# Patient Record
Sex: Male | Born: 2000 | Race: Black or African American | Hispanic: No | Marital: Single | Smoking: Current every day smoker
Health system: Southern US, Community
[De-identification: ages and names within clinical notes are randomized; demographics above are authoritative.]

## PROBLEM LIST (undated history)

## (undated) DIAGNOSIS — J45909 Unspecified asthma, uncomplicated: Secondary | ICD-10-CM

---
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
## 2021-07-29 ENCOUNTER — Other Ambulatory Visit: Payer: Self-pay

## 2021-07-29 ENCOUNTER — Emergency Department (HOSPITAL_BASED_OUTPATIENT_CLINIC_OR_DEPARTMENT_OTHER)
Admission: EM | Admit: 2021-07-29 | Discharge: 2021-07-29 | Disposition: A | Discharge status: Home / Self Care | Payer: Medicaid Other | Attending: Emergency Medicine | Admitting: Emergency Medicine

## 2021-07-29 ENCOUNTER — Encounter (HOSPITAL_BASED_OUTPATIENT_CLINIC_OR_DEPARTMENT_OTHER): Payer: Self-pay | Admitting: Emergency Medicine

## 2021-07-29 ENCOUNTER — Other Ambulatory Visit (HOSPITAL_BASED_OUTPATIENT_CLINIC_OR_DEPARTMENT_OTHER): Payer: Self-pay

## 2021-07-29 DIAGNOSIS — Z202 Contact with and (suspected) exposure to infections with a predominantly sexual mode of transmission: Secondary | ICD-10-CM | POA: Diagnosis present

## 2021-07-29 DIAGNOSIS — Z711 Person with feared health complaint in whom no diagnosis is made: Secondary | ICD-10-CM

## 2021-07-29 LAB — URINALYSIS, ROUTINE W REFLEX MICROSCOPIC
Bilirubin Urine: NEGATIVE
Glucose, UA: NEGATIVE mg/dL
Hgb urine dipstick: NEGATIVE
Ketones, ur: NEGATIVE mg/dL
Nitrite: NEGATIVE
Protein, ur: NEGATIVE mg/dL
Specific Gravity, Urine: 1.025 (ref 1.005–1.030)
pH: 6.5 (ref 5.0–8.0)

## 2021-07-29 LAB — URINALYSIS, MICROSCOPIC (REFLEX)

## 2021-07-29 MED ORDER — DOXYCYCLINE HYCLATE 100 MG PO CAPS
100.0000 mg | ORAL_CAPSULE | Freq: Two times a day (BID) | ORAL | 0 refills | Status: AC
  Filled 2021-07-29: qty 14, 7d supply, fill #0

## 2021-07-29 MED ORDER — CEFTRIAXONE SODIUM 500 MG IJ SOLR
500.0000 mg | Freq: Once | INTRAMUSCULAR | Status: AC
  Administered 2021-07-29: 500 mg via INTRAMUSCULAR
  Filled 2021-07-29: qty 500

## 2021-07-29 MED ORDER — LIDOCAINE HCL (PF) 1 % IJ SOLN
1.0000 mL | Freq: Once | INTRAMUSCULAR | Status: AC
  Administered 2021-07-29: 1 mL
  Filled 2021-07-29: qty 5

## 2021-07-29 NOTE — ED Provider Notes (Signed)
?[facility] EMERGENCY DEPARTMENT ?Provider Note ? ? ?CSN: [id number] ?Arrival date & time: 07/29/21  1042 ? ?  ? ?History ? ?Chief Complaint  ?Patient presents with  ? STD Test  ? ? ?Luke Perkins is a 21 y.o. male presenting to the ED requesting testing and treatment for chlamydia.  States that a male partner that he had recent unprotected intercourse with tested positive for chlamydia and told him that he "needed to be checked."  He is asymptomatic at this time.  Denies any rashes, lesions, penile discharge, abdominal pain, dysuria.  No history of STDs in the past. ? ?HPI ? ?  ? ?Home Medications ?Prior to Admission medications   ?Medication Sig Start Date End Date Taking? Authorizing Provider  ?doxycycline (VIBRAMYCIN) 100 MG capsule Take 1 capsule (100 mg total) by mouth 2 (two) times daily for 7 days. 07/29/21 08/05/21 Yes Kymber Kosar, PA-C  ?acetaminophen (TYLENOL) 160 MG/5ML elixir Take 320 mg by mouth every 6 (six) hours as needed. For fever    [provider]  ?albuterol (PROVENTIL HFA;VENTOLIN HFA) 108 (90 BASE) MCG/ACT inhaler Inhale 2 puffs into the lungs every 6 (six) hours as needed. For cough or wheeze    [provider]  ?albuterol (PROVENTIL) (2.5 MG/3ML) 0.083% nebulizer solution Take 2.5 mg by nebulization every 6 (six) hours as needed. For cough or wheeze    [provider]  ?cetirizine (ZYRTEC) 10 MG tablet Take 10 mg by mouth daily.    [provider]  ?ibuprofen (ADVIL) 600 MG tablet Take 1 tablet (600 mg total) by mouth every 6 (six) hours as needed. 12/02/19   Arby Barrette, MD  ?ibuprofen (ADVIL,MOTRIN) 100 MG/5ML suspension Take 250 mg by mouth every 6 (six) hours as needed. For fever    [provider]  ?montelukast (SINGULAIR) 5 MG chewable tablet Chew 5 mg by mouth at bedtime.    [provider]  ?oseltamivir (TAMIFLU) 6 MG/ML SUSR suspension Take 60 mg by mouth daily.    [provider]  ?   ? ?Allergies     ?Codeine   ? ?Review of Systems   ?Review of Systems  ?Constitutional:  Negative for fever.  ?Gastrointestinal:  Negative for abdominal pain.  ?Genitourinary:  Negative for dysuria, penile discharge, penile pain and penile swelling.  ? ?Physical Exam ?Updated Vital Signs ?BP 128/81   Pulse 74   Temp 98.1 ?F (36.7 ?C) (Oral)   Resp 16   Ht 5\' 6"  (1.676 m)   Wt 61.2 kg   SpO2 98%   BMI 21.79 kg/m?  ?Physical Exam ?Vitals and nursing note reviewed.  ?Constitutional:   ?   General: He is not in acute distress. ?   Appearance: He is well-developed. He is not diaphoretic.  ?HENT:  ?   Head: Normocephalic and atraumatic.  ?Eyes:  ?   General: No scleral icterus. ?   Conjunctiva/sclera: Conjunctivae normal.  ?Pulmonary:  ?   Effort: Pulmonary effort is normal. No respiratory distress.  ?Musculoskeletal:  ?   Cervical back: Normal range of motion.  ?Skin: ?   Findings: No rash.  ?Neurological:  ?   Mental Status: He is alert.  ? ? ?ED Results / Procedures / Treatments   ?Labs ?(all labs ordered are listed, but only abnormal results are displayed) ?Labs Reviewed  ?URINALYSIS, ROUTINE W REFLEX MICROSCOPIC - Abnormal; Notable for the following components:  ?    Result Value  ? Leukocytes,Ua TRACE (*)   ?  All other components within normal limits  ?URINALYSIS, MICROSCOPIC (REFLEX) - Abnormal; Notable for the following components:  ? Bacteria, UA FEW (*)   ? All other components within normal limits  ?GC/CHLAMYDIA PROBE AMP ([place]) NOT AT [facility]  ? ? ?EKG ?None ? ?Radiology ?No results found. ? ?Procedures ?Procedures  ? ? ?Medications Ordered in ED ?Medications  ?cefTRIAXone (ROCEPHIN) injection 500 mg (has no administration in time range)  ?lidocaine (PF) (XYLOCAINE) 1 % injection 1 mL (has no administration in time range)  ? ? ?ED Course/ Medical Decision Making/ A&P ?  ?                        ?Medical Decision Making ?Amount and/or Complexity of Data Reviewed ?Labs: ordered. ? ?Risk ?Prescription drug  management. ? ? ?21 year old male presenting to the ED requesting testing and treatment for chlamydia.  Recent partner tested positive for chlamydia.  He is asymptomatic at this time.  Will test with a urine sample.  Urinalysis with trace leukocytes and few bacteria but patient asymptomatic.  We will treat with Rocephin and doxycycline.  He declines HIV and RPR testing.  He is hemodynamically stable without any other complaints.  Return precautions given. ? ? ? ?Patient is hemodynamically stable, in NAD, and able to ambulate in the ED. Evaluation does not show pathology that would require ongoing emergent intervention or inpatient treatment. I explained the diagnosis to the patient. Pain has been managed and has no complaints prior to discharge. Patient is comfortable with above plan and is stable for discharge at this time. All questions were answered prior to disposition. Strict return precautions for returning to the ED were discussed. Encouraged follow up with PCP.  ? ?An After Visit Summary was printed and given to the patient. ? ? ?Portions of this note were generated with Scientist, clinical (histocompatibility and immunogenetics). Dictation errors may occur despite best attempts at proofreading. ? ? ? ? ? ? ? ? ?Final Clinical Impression(s) / ED Diagnoses ?Final diagnoses:  ?Concern about STD in male without diagnosis  ? ? ?Rx / DC Orders ?ED Discharge Orders   ? ?      Ordered  ?  doxycycline (VIBRAMYCIN) 100 MG capsule  2 times daily       ? 07/29/21 1243  ? ?  ?  ? ?  ? ? ?  ?Dietrich Pates, PA-C ?07/29/21 1245 ? ?  ?Alvira Monday, MD ?07/29/21 2259 ? ?

## 2021-07-29 NOTE — Discharge Instructions (Addendum)
We will contact you with the results of your STD testing when it is available. ?I provided you with treatment for this. ?Return to the ER if you start to experience severe abdominal pain, uncontrollable vomiting, fever or bloody stools. ?

## 2021-07-29 NOTE — ED Notes (Signed)
Pt discharged to home. Discharge instructions have been discussed with patient and/or family members. Pt verbally acknowledges understanding d/c instructions, and endorses comprehension to checkout at registration before leaving.  °

## 2021-07-29 NOTE — ED Triage Notes (Signed)
Pt arrives pov, requesting STD eval. For chlamydia. Denies symptoms ?

## 2021-07-29 NOTE — ED Notes (Signed)
ED Provider at bedside. 

## 2021-08-01 LAB — GC/CHLAMYDIA PROBE AMP (~~LOC~~) NOT AT ARMC
Chlamydia: POSITIVE — AB
Comment: NEGATIVE
Comment: NORMAL
Neisseria Gonorrhea: NEGATIVE
